# Patient Record
Sex: Female | Born: 1975 | Race: Black or African American | Hispanic: No | Marital: Single | Smoking: Never smoker
Health system: Southern US, Community
[De-identification: ages and names within clinical notes are randomized; demographics above are authoritative.]

## PROBLEM LIST (undated history)

## (undated) DIAGNOSIS — F419 Anxiety disorder, unspecified: Secondary | ICD-10-CM

---

## 2014-09-14 ENCOUNTER — Ambulatory Visit: Payer: Self-pay | Admitting: Family Medicine

## 2014-11-09 ENCOUNTER — Encounter: Payer: Self-pay | Admitting: Family Medicine

## 2018-06-25 ENCOUNTER — Other Ambulatory Visit: Payer: Self-pay | Admitting: Obstetrics and Gynecology

## 2018-06-25 DIAGNOSIS — Z1231 Encounter for screening mammogram for malignant neoplasm of breast: Secondary | ICD-10-CM

## 2018-08-05 ENCOUNTER — Ambulatory Visit (HOSPITAL_COMMUNITY): Payer: Self-pay

## 2020-07-12 ENCOUNTER — Emergency Department (HOSPITAL_BASED_OUTPATIENT_CLINIC_OR_DEPARTMENT_OTHER)
Admission: EM | Admit: 2020-07-12 | Discharge: 2020-07-12 | Disposition: A | Discharge status: Home / Self Care | Payer: No Typology Code available for payment source | Attending: Emergency Medicine | Admitting: Emergency Medicine

## 2020-07-12 ENCOUNTER — Encounter (HOSPITAL_BASED_OUTPATIENT_CLINIC_OR_DEPARTMENT_OTHER): Payer: Self-pay | Admitting: Emergency Medicine

## 2020-07-12 ENCOUNTER — Other Ambulatory Visit: Payer: Self-pay

## 2020-07-12 DIAGNOSIS — M79661 Pain in right lower leg: Secondary | ICD-10-CM | POA: Insufficient documentation

## 2020-07-12 DIAGNOSIS — Z5321 Procedure and treatment not carried out due to patient leaving prior to being seen by health care provider: Secondary | ICD-10-CM | POA: Insufficient documentation

## 2020-07-12 NOTE — ED Triage Notes (Signed)
Pt is c/o pain in her right calf that started earlier today  Denies injury  Denies recent travel

## 2020-07-12 NOTE — ED Notes (Signed)
Per registration, pt leaving at this time. LWBS 

## 2020-07-13 ENCOUNTER — Emergency Department (HOSPITAL_BASED_OUTPATIENT_CLINIC_OR_DEPARTMENT_OTHER)
Admission: EM | Admit: 2020-07-13 | Discharge: 2020-07-13 | Disposition: A | Discharge status: Home / Self Care | Payer: No Typology Code available for payment source | Attending: Emergency Medicine | Admitting: Emergency Medicine

## 2020-07-13 ENCOUNTER — Emergency Department (HOSPITAL_BASED_OUTPATIENT_CLINIC_OR_DEPARTMENT_OTHER): Payer: No Typology Code available for payment source

## 2020-07-13 ENCOUNTER — Other Ambulatory Visit: Payer: Self-pay

## 2020-07-13 ENCOUNTER — Encounter (HOSPITAL_BASED_OUTPATIENT_CLINIC_OR_DEPARTMENT_OTHER): Payer: Self-pay | Admitting: Emergency Medicine

## 2020-07-13 DIAGNOSIS — E876 Hypokalemia: Secondary | ICD-10-CM

## 2020-07-13 DIAGNOSIS — R072 Precordial pain: Secondary | ICD-10-CM

## 2020-07-13 DIAGNOSIS — M79661 Pain in right lower leg: Secondary | ICD-10-CM | POA: Diagnosis not present

## 2020-07-13 DIAGNOSIS — R079 Chest pain, unspecified: Secondary | ICD-10-CM | POA: Diagnosis present

## 2020-07-13 HISTORY — DX: Anxiety disorder, unspecified: F41.9

## 2020-07-13 LAB — CBC
HCT: 38.4 % (ref 36.0–46.0)
Hemoglobin: 12.2 g/dL (ref 12.0–15.0)
MCH: 26.4 pg (ref 26.0–34.0)
MCHC: 31.8 g/dL (ref 30.0–36.0)
MCV: 83.1 fL (ref 80.0–100.0)
Platelets: 332 10*3/uL (ref 150–400)
RBC: 4.62 MIL/uL (ref 3.87–5.11)
RDW: 13.9 % (ref 11.5–15.5)
WBC: 8.6 10*3/uL (ref 4.0–10.5)
nRBC: 0 % (ref 0.0–0.2)

## 2020-07-13 LAB — TROPONIN I (HIGH SENSITIVITY)
Troponin I (High Sensitivity): <2 ng/L (ref <18)
Troponin I (High Sensitivity): <2 ng/L (ref <18)

## 2020-07-13 LAB — D-DIMER, QUANTITATIVE: D-Dimer, Quant: <0.27 ug/mL-FEU (ref 0.00–0.50)

## 2020-07-13 LAB — BASIC METABOLIC PANEL
Anion gap: 10 (ref 5–15)
BUN: 14 mg/dL (ref 6–20)
CO2: 24 mmol/L (ref 22–32)
Calcium: 8.8 mg/dL — ABNORMAL LOW (ref 8.9–10.3)
Chloride: 105 mmol/L (ref 98–111)
Creatinine, Ser: 0.74 mg/dL (ref 0.44–1.00)
GFR calc Af Amer: >60 mL/min (ref >60)
GFR calc non Af Amer: >60 mL/min (ref >60)
Glucose, Bld: 102 mg/dL — ABNORMAL HIGH (ref 70–99)
Potassium: 3.1 mmol/L — ABNORMAL LOW (ref 3.5–5.1)
Sodium: 139 mmol/L (ref 135–145)

## 2020-07-13 LAB — PREGNANCY, URINE: Preg Test, Ur: NEGATIVE

## 2020-07-13 MED ORDER — POTASSIUM CHLORIDE CRYS ER 20 MEQ PO TBCR
40.0000 meq | EXTENDED_RELEASE_TABLET | Freq: Once | ORAL | Status: AC
  Administered 2020-07-13: 40 meq via ORAL
  Filled 2020-07-13: qty 2

## 2020-07-13 NOTE — ED Triage Notes (Signed)
Reports central chest pain for a few minutes tonight. Continues to complain of leg pain on R side, onset earlier today.

## 2020-07-13 NOTE — ED Provider Notes (Signed)
[facility] EMERGENCY DEPARTMENT Provider Note   CSN: [id number] Arrival date & time: 07/13/20  0347     History Chief Complaint  Patient presents with  . Chest Pain  . Leg Pain    Whitney Anthony is a 44 y.o. female.  Patient c/o mid chest pain at rest early this AM. Was resting, noted sharp pain, in midline, lower sternum, non radiating, lasted a few minutes. Denies any recent exertional cp or discomfort. No unusual doe or fatigue. No associated nv, diaphoresis, or sob. No cough or uri symptoms. No heartburn. No hx cad. No hx gerd. No hx dvt or pe. No fam hx premature cad - states one of her grandparents had heart trouble. Patient also indicates intermittently in past she has pain in right lower leg/calf. No immobility, trauma, travel, surgery, or injury. States in the past when that happens she will walk it out, or stretch, and pain goes away. No swelling to leg. No skin changes/redness.   The history is provided by the patient.  Chest Pain Associated symptoms: no abdominal pain, no back pain, no cough, no fever, no headache, no numbness, no palpitations, no shortness of breath and no vomiting   Leg Pain Associated symptoms: no back pain, no fever and no neck pain        Past Medical History:  Diagnosis Date  . Anxiety     There are no problems to display for this patient.   History reviewed. No pertinent surgical history.   OB History   No obstetric history on file.     Family History  Problem Relation Age of Onset  . Pancreatitis Mother     Social History   Tobacco Use  . Smoking status: Never Smoker  . Smokeless tobacco: Never Used  Vaping Use  . Vaping Use: Never used  Substance Use Topics  . Alcohol use: Never  . Drug use: Never    Home Medications Prior to Admission medications   Not on File    Allergies    Patient has no known allergies.  Review of Systems   Review of Systems  Constitutional: Negative for chills and fever.   HENT: Negative for sore throat.   Eyes: Negative for redness.  Respiratory: Negative for cough and shortness of breath.   Cardiovascular: Positive for chest pain. Negative for palpitations and leg swelling.  Gastrointestinal: Negative for abdominal pain and vomiting.  Genitourinary: Negative for flank pain.  Musculoskeletal: Negative for back pain and neck pain.  Skin: Negative for rash.  Neurological: Negative for numbness and headaches.  Hematological: Does not bruise/bleed easily.  Psychiatric/Behavioral: Negative for confusion.    Physical Exam Updated Vital Signs BP 114/67 (BP Location: Right Arm)   Pulse 78   Temp 98.6 F (37 C) (Oral)   Resp 16   Ht 1.651 m (5\' 5" )   Wt 72.6 kg   LMP 06/26/2020 (Exact Date)   SpO2 100%   BMI 26.63 kg/m   Physical Exam Vitals and nursing note reviewed.  Constitutional:      Appearance: Normal appearance. She is well-developed.  HENT:     Head: Atraumatic.     Nose: Nose normal.     Mouth/Throat:     Mouth: Mucous membranes are moist.  Eyes:     General: No scleral icterus.    Conjunctiva/sclera: Conjunctivae normal.  Neck:     Trachea: No tracheal deviation.  Cardiovascular:     Rate and Rhythm: Normal rate and regular rhythm.  Pulses: Normal pulses.     Heart sounds: Normal heart sounds. No murmur heard.  No friction rub. No gallop.   Pulmonary:     Effort: Pulmonary effort is normal. No respiratory distress.     Breath sounds: Normal breath sounds.  Chest:     Chest wall: No tenderness.  Abdominal:     General: Bowel sounds are normal. There is no distension.     Palpations: Abdomen is soft.     Tenderness: There is no abdominal tenderness. There is no guarding.  Genitourinary:    Comments: No cva tenderness.  Musculoskeletal:        General: No swelling or tenderness.     Cervical back: Normal range of motion and neck supple. No rigidity. No muscular tenderness.     Right lower leg: No edema.     Left lower  leg: No edema.     Comments: Lower extremities appear normal, no swelling. No focal bony pain, no calf tenderness. Distal pulses palp bil.   Skin:    General: Skin is warm and dry.     Findings: No rash.  Neurological:     Mental Status: She is alert.     Comments: Alert, speech normal. Steady gait.   Psychiatric:        Mood and Affect: Mood normal.     ED Results / Procedures / Treatments   Labs (all labs ordered are listed, but only abnormal results are displayed) Results for orders placed or performed during the hospital encounter of 07/13/20  Basic metabolic panel  Result Value Ref Range   Sodium 139 135 - 145 mmol/L   Potassium 3.1 (L) 3.5 - 5.1 mmol/L   Chloride 105 98 - 111 mmol/L   CO2 24 22 - 32 mmol/L   Glucose, Bld 102 (H) 70 - 99 mg/dL   BUN 14 6 - 20 mg/dL   Creatinine, Ser 8.12 0.44 - 1.00 mg/dL   Calcium 8.8 (L) 8.9 - 10.3 mg/dL   GFR calc non Af Amer >60 >60 mL/min   GFR calc Af Amer >60 >60 mL/min   Anion gap 10 5 - 15  CBC  Result Value Ref Range   WBC 8.6 4.0 - 10.5 K/uL   RBC 4.62 3.87 - 5.11 MIL/uL   Hemoglobin 12.2 12.0 - 15.0 g/dL   HCT 75.1 36 - 46 %   MCV 83.1 80.0 - 100.0 fL   MCH 26.4 26.0 - 34.0 pg   MCHC 31.8 30.0 - 36.0 g/dL   RDW 70.0 17.4 - 94.4 %   Platelets 332 150 - 400 K/uL   nRBC 0.0 0.0 - 0.2 %  D-dimer, quantitative (not at [facility])  Result Value Ref Range   D-Dimer, Quant <0.27 0.00 - 0.50 ug/mL-FEU  Troponin I (High Sensitivity)  Result Value Ref Range   Troponin I (High Sensitivity) <2 <18 ng/L  Troponin I (High Sensitivity)  Result Value Ref Range   Troponin I (High Sensitivity) <2 <18 ng/L   DG Chest 2 View  Result Date: 07/13/2020 CLINICAL DATA:  Central chest pain for a few minutes EXAM: CHEST - 2 VIEW COMPARISON:  None. FINDINGS: The heart size and mediastinal contours are within normal limits. Both lungs are clear. The visualized skeletal structures are unremarkable. IMPRESSION: No active cardiopulmonary disease.  Electronically Signed   By: Burman Nieves M.D.   On: 07/13/2020 04:12    EKG EKG Interpretation  Date/Time:  Friday July 13 2020 03:53:11 EDT Ventricular  Rate:  79 PR Interval:  186 QRS Duration: 76 QT Interval:  380 QTC Calculation: 435 R Axis:   78 Text Interpretation: Normal sinus rhythm with sinus arrhythmia Normal ECG No previous ECGs available Confirmed by Paula Libra ([id number]) on 07/13/2020 4:20:06 AM   Radiology DG Chest 2 View  Result Date: 07/13/2020 CLINICAL DATA:  Central chest pain for a few minutes EXAM: CHEST - 2 VIEW COMPARISON:  None. FINDINGS: The heart size and mediastinal contours are within normal limits. Both lungs are clear. The visualized skeletal structures are unremarkable. IMPRESSION: No active cardiopulmonary disease. Electronically Signed   By: Burman Nieves M.D.   On: 07/13/2020 04:12    Procedures Procedures (including critical care time)  Medications Ordered in ED Medications - No data to display  ED Course  I have reviewed the triage vital signs and the nursing notes.  Pertinent labs & imaging results that were available during my care of the patient were reviewed by me and considered in my medical decision making (see chart for details).    MDM Rules/Calculators/A&P                         Labs sent.  ECZG. CXR.  Reviewed nursing notes and prior charts for additional history.   Labs reviewed/interpreted by me - trop normal. k sl elev. kcl po.   CXR reviewed/interpreted by me - no pna.  Additional labs reviewed/interpreted by me - repeat trop normal. ddimer normal.   Patient currently symptom free, and appears stable for d/c.   Rec pcp f/u.    Final Clinical Impression(s) / ED Diagnoses Final diagnoses:  None    Rx / DC Orders ED Discharge Orders    None       Cathren Laine, MD 07/13/20 [phone]

## 2020-07-13 NOTE — Discharge Instructions (Addendum)
It was our pleasure to provide your ER care today - we hope that you feel better.  Your heart tests look good/normal.  From today's labs, your potassium level is mildly low (3.1) - eat plenty of fruits and vegetables, and follow up with primary care doctor in 1-2 weeks.   Take ibuprofen or naprosyn as need.  If/when calf/muscle pain, try stretching of area, heat therapy, or gentle massage (if leg swelling, and/or severe pain, return to ED).  Return to ER if worse, new symptoms, fevers, increased trouble breathing, recurrent/persistent chest pain, leg swelling or severe pain, or other concern.

## 2022-01-06 ENCOUNTER — Emergency Department (HOSPITAL_BASED_OUTPATIENT_CLINIC_OR_DEPARTMENT_OTHER): Payer: No Typology Code available for payment source

## 2022-01-06 ENCOUNTER — Emergency Department (HOSPITAL_BASED_OUTPATIENT_CLINIC_OR_DEPARTMENT_OTHER)
Admission: EM | Admit: 2022-01-06 | Discharge: 2022-01-07 | Disposition: A | Discharge status: Home / Self Care | Payer: No Typology Code available for payment source | Attending: Emergency Medicine | Admitting: Emergency Medicine

## 2022-01-06 ENCOUNTER — Encounter (HOSPITAL_BASED_OUTPATIENT_CLINIC_OR_DEPARTMENT_OTHER): Payer: Self-pay

## 2022-01-06 ENCOUNTER — Other Ambulatory Visit: Payer: Self-pay

## 2022-01-06 DIAGNOSIS — M79662 Pain in left lower leg: Secondary | ICD-10-CM | POA: Diagnosis not present

## 2022-01-06 DIAGNOSIS — M79604 Pain in right leg: Secondary | ICD-10-CM | POA: Insufficient documentation

## 2022-01-06 DIAGNOSIS — M25512 Pain in left shoulder: Secondary | ICD-10-CM | POA: Insufficient documentation

## 2022-01-06 DIAGNOSIS — M79669 Pain in unspecified lower leg: Secondary | ICD-10-CM

## 2022-01-06 DIAGNOSIS — R222 Localized swelling, mass and lump, trunk: Secondary | ICD-10-CM

## 2022-01-06 DIAGNOSIS — M25562 Pain in left knee: Secondary | ICD-10-CM | POA: Insufficient documentation

## 2022-01-06 MED ORDER — CELECOXIB 200 MG PO CAPS: 200.0000 mg | ORAL_CAPSULE | Freq: Two times a day (BID) | ORAL | 0 refills | Status: AC

## 2022-01-06 MED ORDER — METHYLPREDNISOLONE 4 MG PO TBPK: ORAL_TABLET | ORAL | 0 refills | Status: AC

## 2022-01-06 NOTE — ED Provider Notes (Signed)
[facility] EMERGENCY DEPARTMENT Provider Note   CSN: [id number] Arrival date & time: 01/06/22  2020     History  Chief Complaint  Patient presents with   Leg Pain    Whitney Anthony is a 46 y.o. female who presents with right leg pain and left shoulder pain.  Patient states that 3 days ago she began having tightness and pain behind the left knee and pain in her left calf.  The pain is worse when she is sitting or lying down, better when she is standing and walking.  At times pain is 9 out of 10, currently 5 out of 10.  She describes the pain as aching.  She is never had anything like this before.  She denies back pain, injury. Patient also noticed at tender swollen nodule above her left clavicle beginning 2 days ago.  She denies cough, shortness of breath, weakness, unexplained weight loss, fever, soaking night sweats.   Leg Pain     Home Medications Prior to Admission medications   Not on File      Allergies    Patient has no known allergies.    Review of Systems   Review of Systems As per the HPI Physical Exam Updated Vital Signs BP 120/78 (BP Location: Left Arm)    Pulse 80    Temp 98.4 F (36.9 C) (Oral)    Resp 18    Ht 5\' 5"  (1.651 m)    Wt 81.6 kg    SpO2 100%    BMI 29.95 kg/m  Physical Exam Vitals and nursing note reviewed.  Constitutional:      General: She is not in acute distress.    Appearance: She is well-developed. She is not diaphoretic.  HENT:     Head: Normocephalic and atraumatic.     Right Ear: External ear normal.     Left Ear: External ear normal.     Nose: Nose normal.     Mouth/Throat:     Mouth: Mucous membranes are moist.  Eyes:     General: No scleral icterus.    Conjunctiva/sclera: Conjunctivae normal.  Cardiovascular:     Rate and Rhythm: Normal rate and regular rhythm.     Heart sounds: Normal heart sounds. No murmur heard.   No friction rub. No gallop.  Pulmonary:     Effort: Pulmonary effort is normal. No  respiratory distress.     Breath sounds: Normal breath sounds.  Chest:    Abdominal:     General: Bowel sounds are normal. There is no distension.     Palpations: Abdomen is soft. There is no mass.     Tenderness: There is no abdominal tenderness. There is no guarding.  Musculoskeletal:        General: No swelling or tenderness.     Cervical back: Normal range of motion.     Comments: Positive straight leg test on the left at approximately 35 degrees Normal pulses no obvious swelling  Skin:    General: Skin is warm and dry.  Neurological:     Mental Status: She is alert and oriented to person, place, and time.  Psychiatric:        Behavior: Behavior normal.    ED Results / Procedures / Treatments   Labs (all labs ordered are listed, but only abnormal results are displayed) Labs Reviewed - No data to display  EKG None  Radiology No results found.  Procedures Procedures    Medications Ordered in ED Medications -  No data to display  ED Course/ Medical Decision Making/ A&P                           Medical Decision Making Patient here with complaint of right sided calf pain and swelling in the left supraclavicular region. Regarding the calf pain differential diagnosis includes radiculopathy, neuralgia, neuropathy, cellulitis.  I reviewed ultrasound images ordered at triage which showed no evidence of DVT.  She has a positive straight leg test and I suspect that her symptoms are related to sciatica.  We will treat with anti-inflammatory medication and a Medrol Dosepak. Patient does have tender mobile mass in the left upper supraclavicular region.  I ordered and reviewed a chest x-ray which showed no acute findings.  Question supraclavicular lymph node.  Patient is advised to follow very closely with her primary care physician for further evaluation.  She does not have other palpable lymphadenopathy.  Patient appears appropriate for discharge with conservative treatment and  close PCP follow-up.  Amount and/or Complexity of Data Reviewed Radiology: ordered and independent interpretation performed. ECG/medicine tests: ordered.  Risk Prescription drug management.   Final Clinical Impression(s) / ED Diagnoses Final diagnoses:  None    Rx / DC Orders ED Discharge Orders     None         Arthor Captain, PA-C 01/08/22 0416    Pricilla Loveless, MD 01/11/22 [phone]

## 2022-01-06 NOTE — Discharge Instructions (Addendum)
° °  Follow up ASAP with your primary care doctor  Contact a health care provider if: You have pain that: Wakes you up when you are sleeping. Gets worse when you lie down. Is worse than you have experienced in the past. Lasts longer than 4 weeks. You have an unexplained weight loss. Get help right away if: You are not able to control when you urinate or have bowel movements (incontinence). You have: Weakness in your lower back, pelvis, buttocks, or legs that gets worse. Redness or swelling of your back. A burning sensation when you urinate.

## 2022-01-06 NOTE — ED Triage Notes (Signed)
Pt c/o pain to left calf day 2-c/o swelling to left side of neck day 3-denies injury at either site-NAD-steady gait

## 2023-09-10 IMAGING — DX DG CHEST 2V
2 series · 2 of 2 positions shown · non-contrast
Comparison: 07/13/2020

CLINICAL DATA: Left supraclavicular pain

EXAM:
CHEST - 2 VIEW

[chest pa]
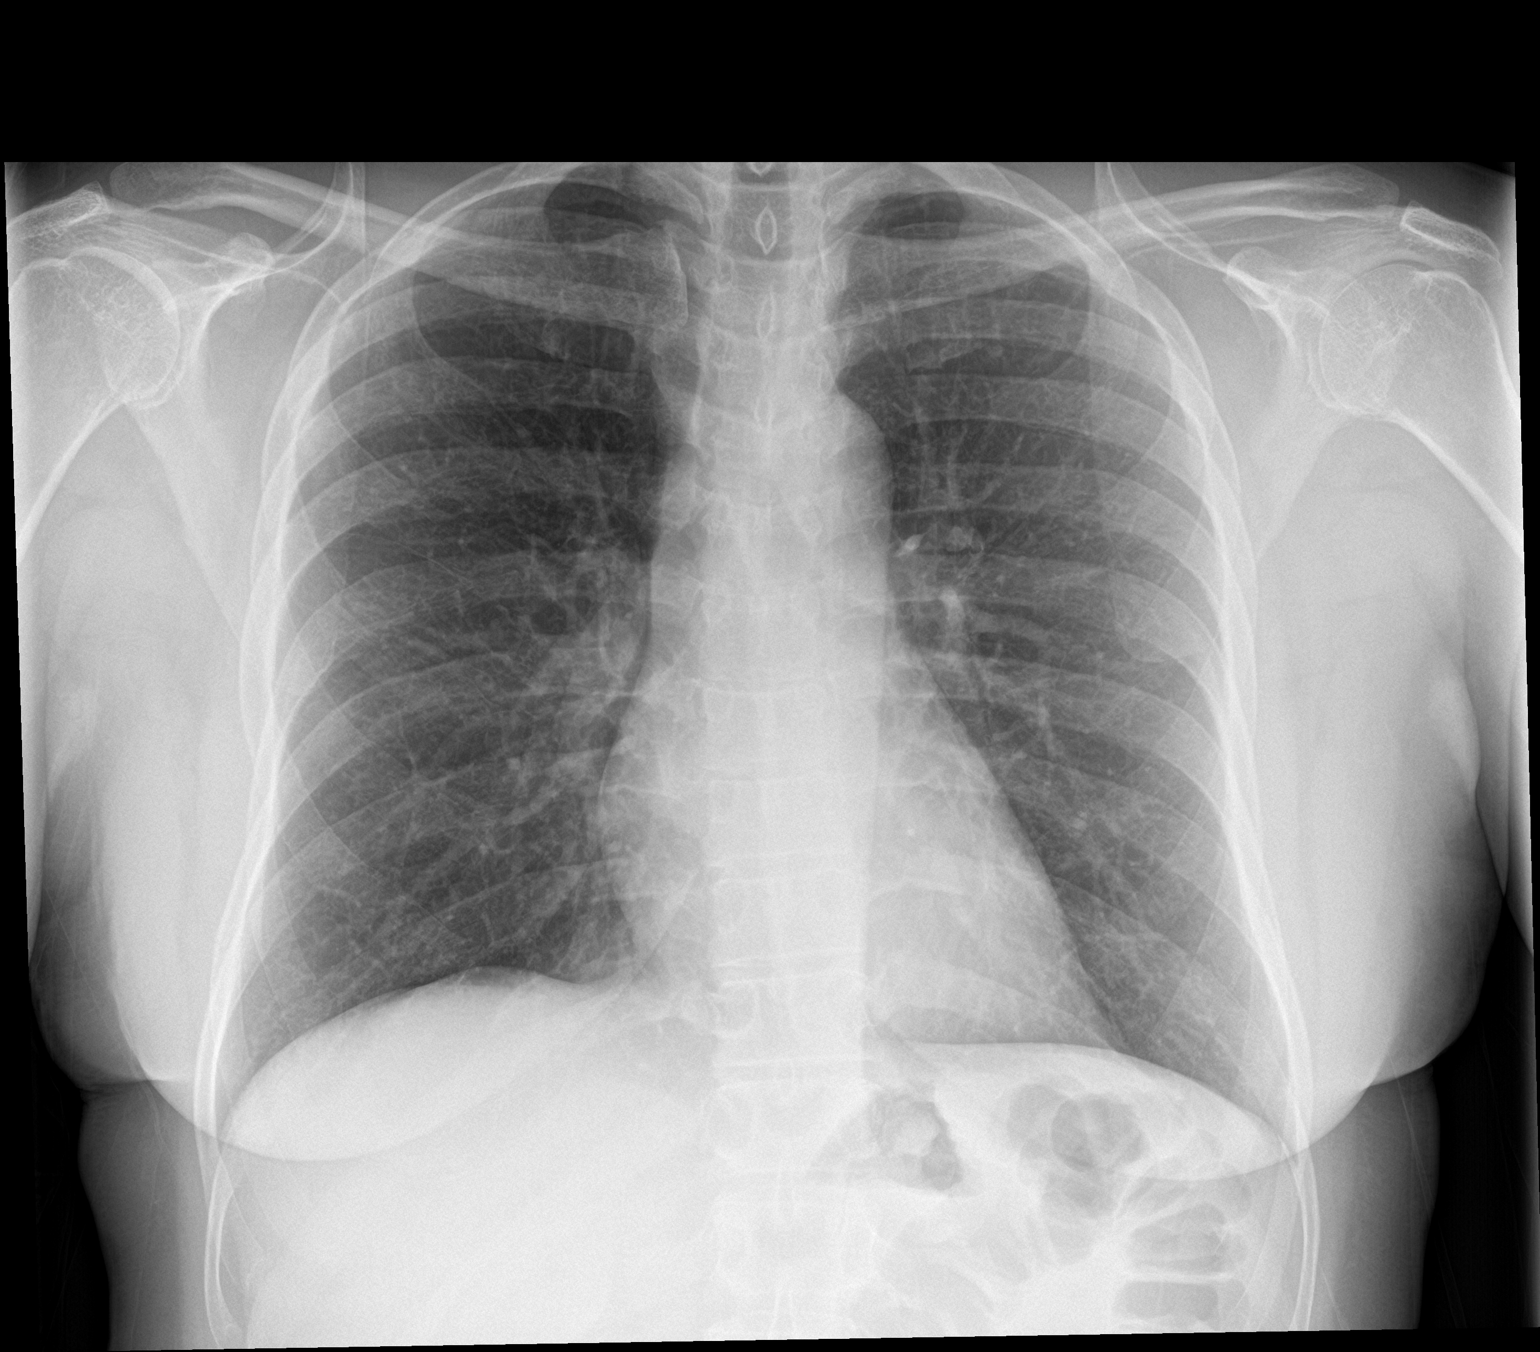

[chest lat]
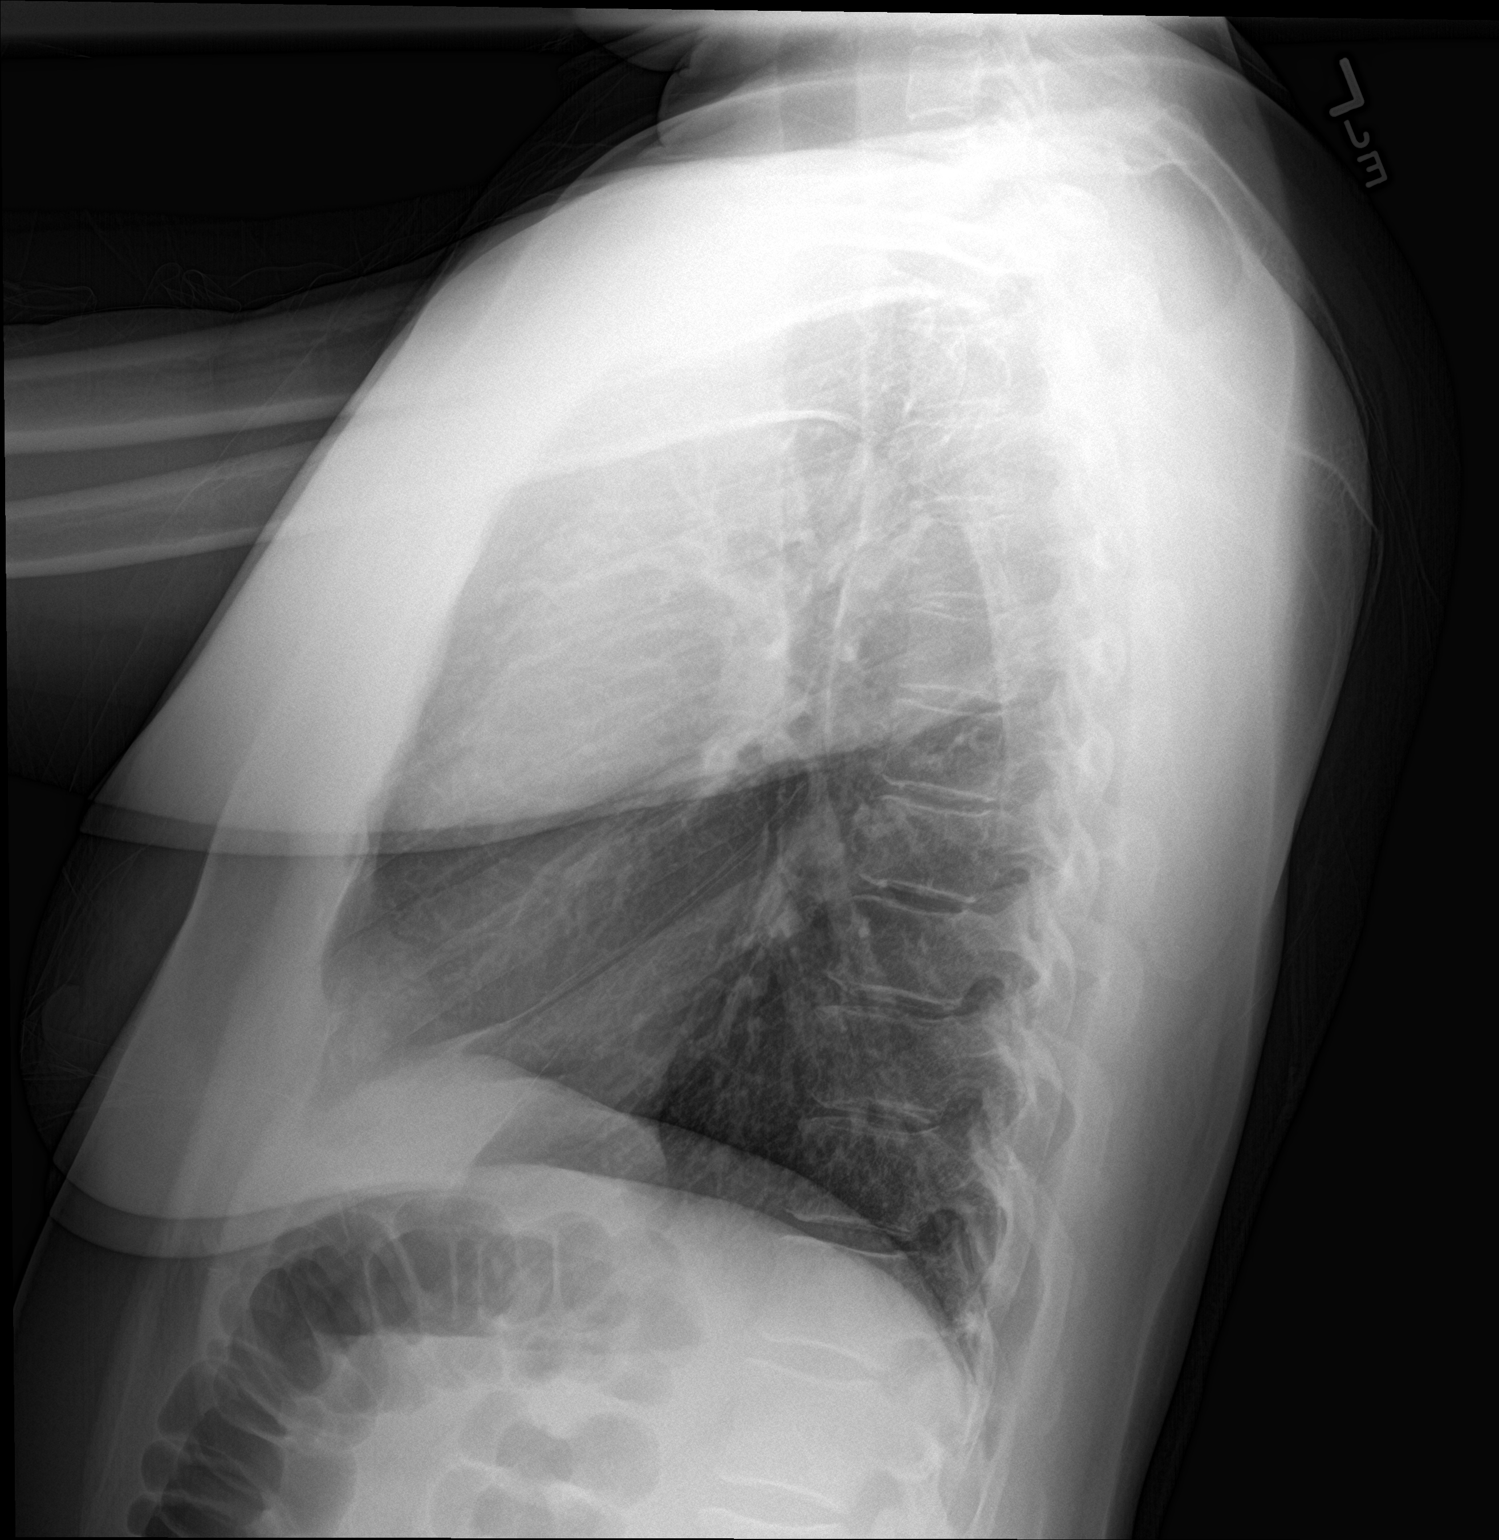

[2 of 2 positions shown; findings below may reference images not displayed]

FINDINGS: The heart size and mediastinal contours are within normal limits.
Both lungs are clear. The visualized skeletal structures are
unremarkable.
IMPRESSION: No active cardiopulmonary disease.

## 2023-09-10 IMAGING — US US EXTREM LOW VENOUS*R*
1 series · 14 of 24 positions shown · non-contrast
Comparison: None.

CLINICAL DATA: Calf pain.

EXAM:
RIGHT LOWER EXTREMITY VENOUS DOPPLER ULTRASOUND
TECHNIQUE: Gray-scale sonography with compression, as well as color and duplex
ultrasound, were performed to evaluate the deep venous system(s)
from the level of the common femoral vein through the popliteal and
proximal calf veins.

[Series 1: us extrem low venous*right* · 14 of 28 slices shown]
[im 1/28]
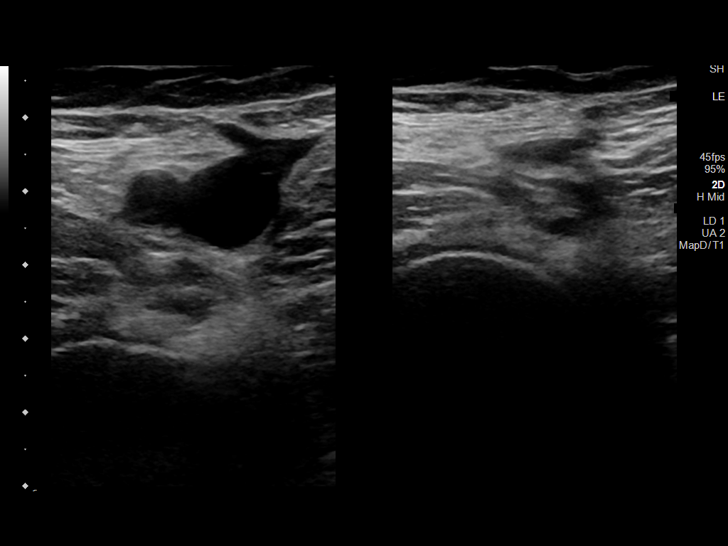
[im 3/28]
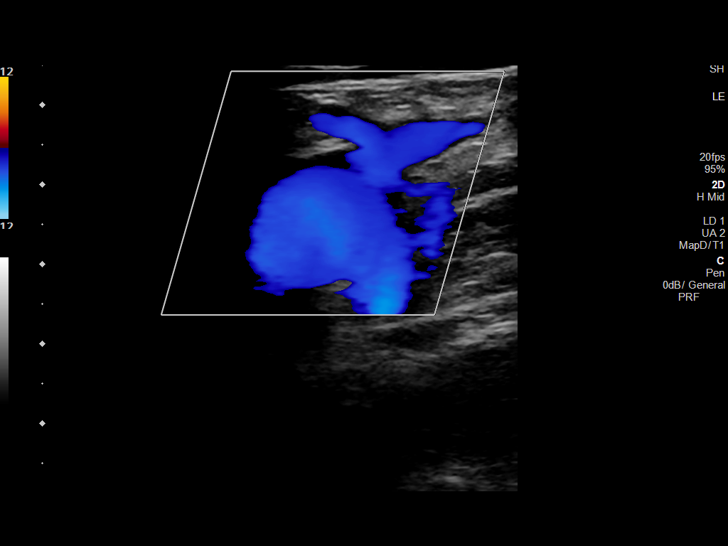
[im 5/28]
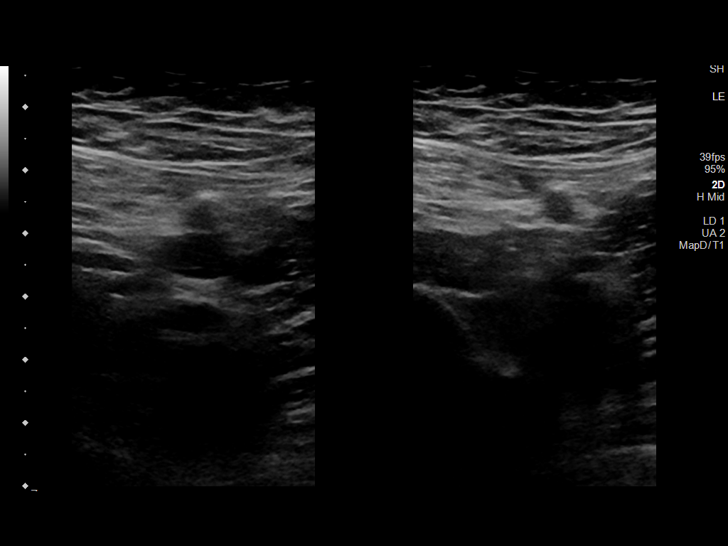
[im 8/28]
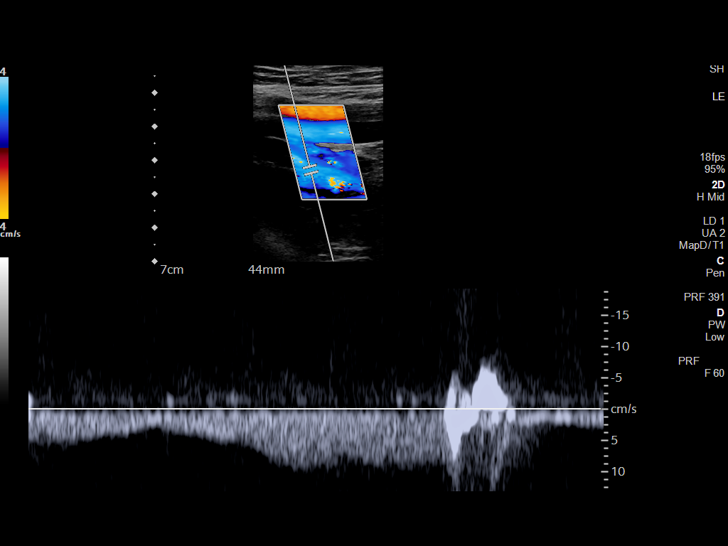
[im 9/28]
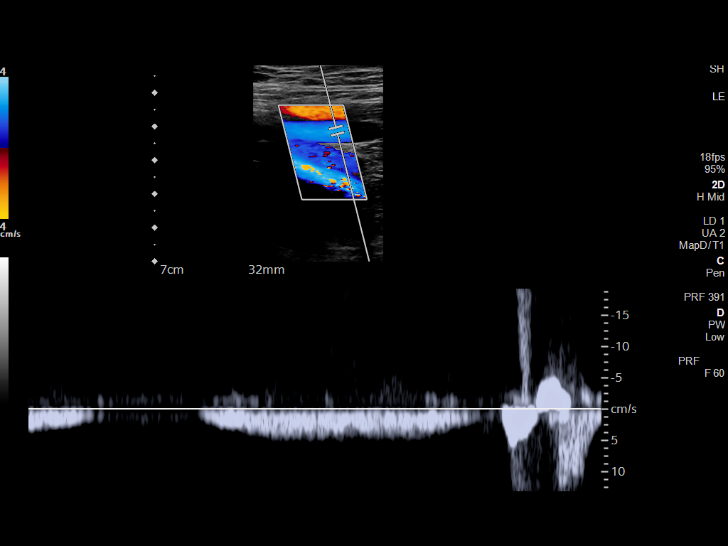
[im 11/28]
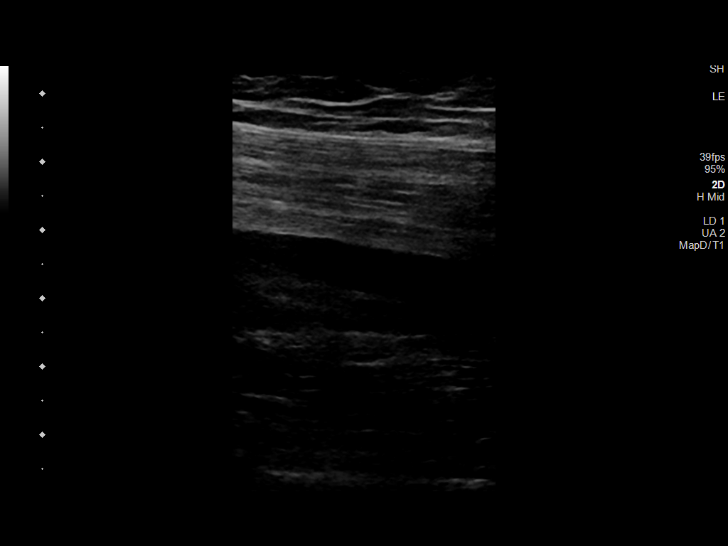
[im 13/28]
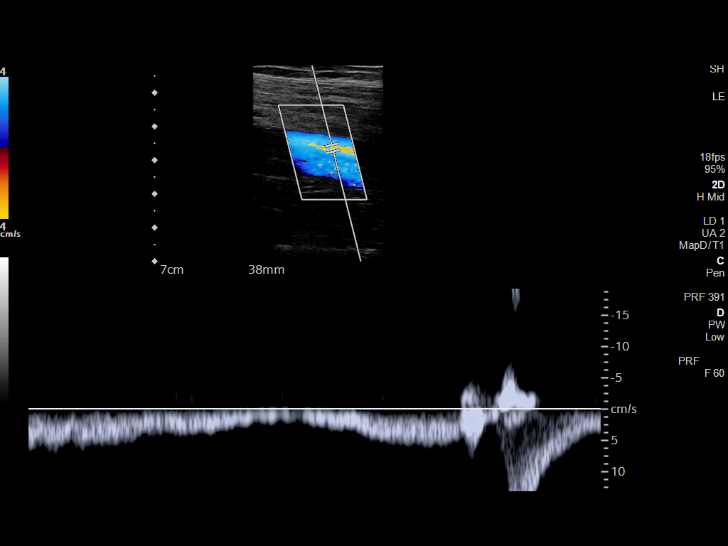
[im 15/28]
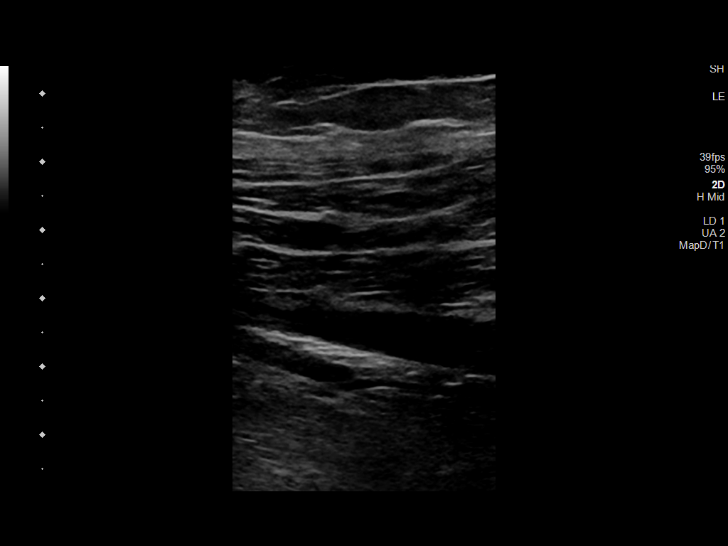
[im 17/28]
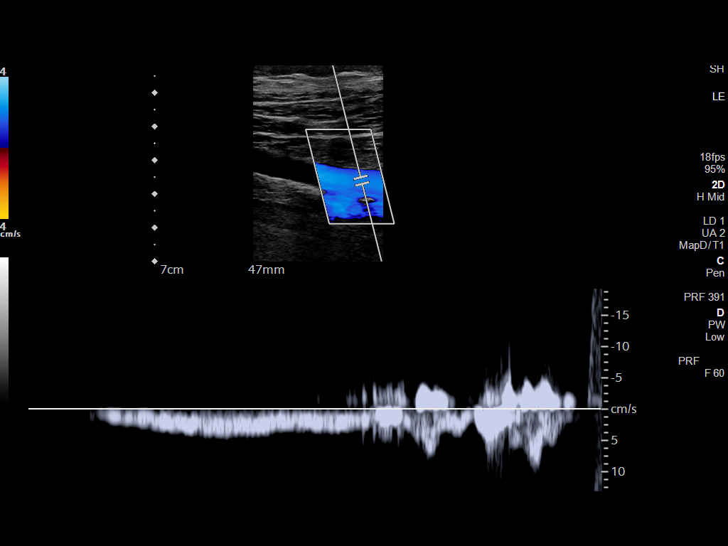
[im 19/28]
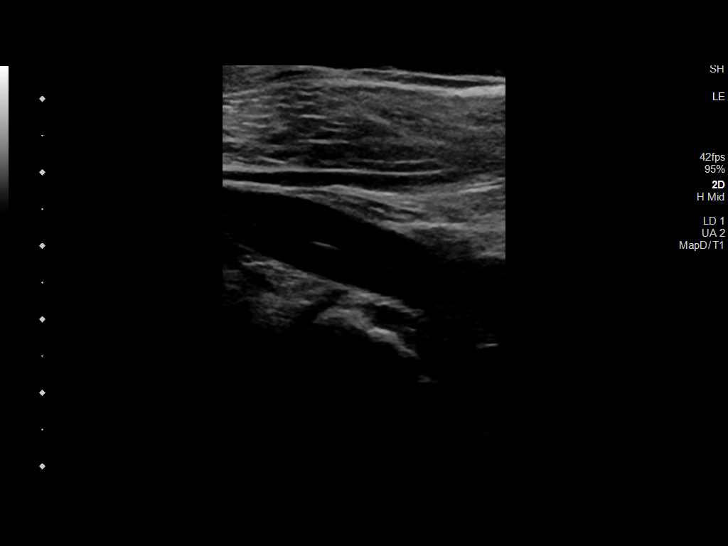
[im 22/28]
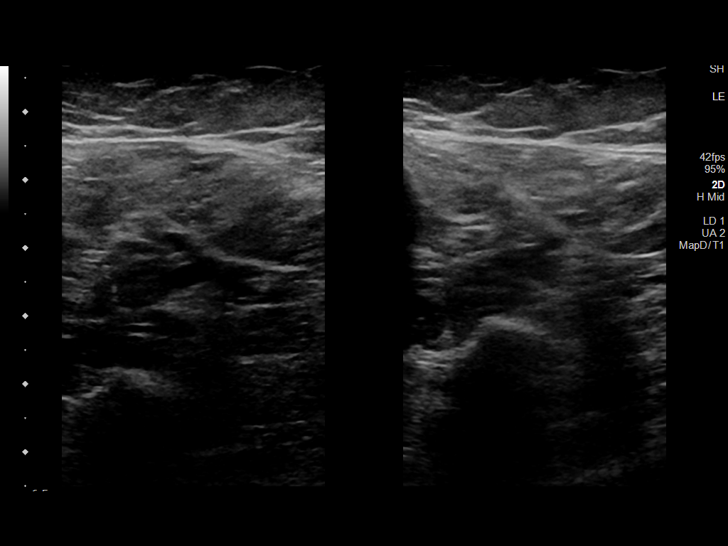
[im 23/28]
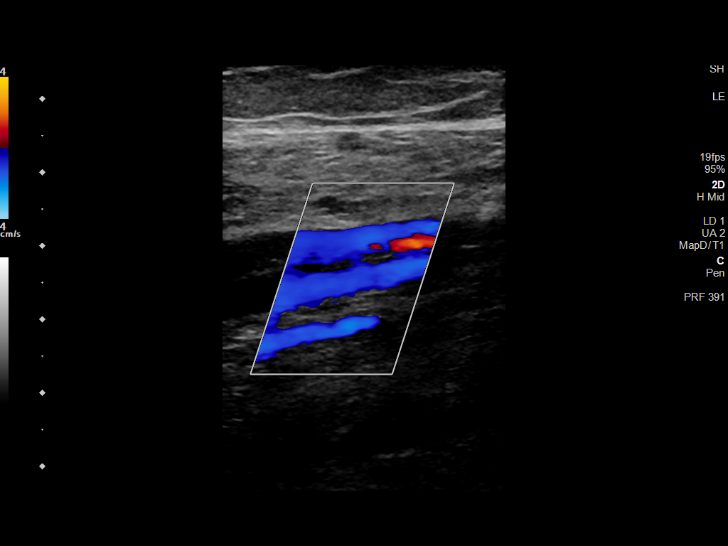
[im 25/28]
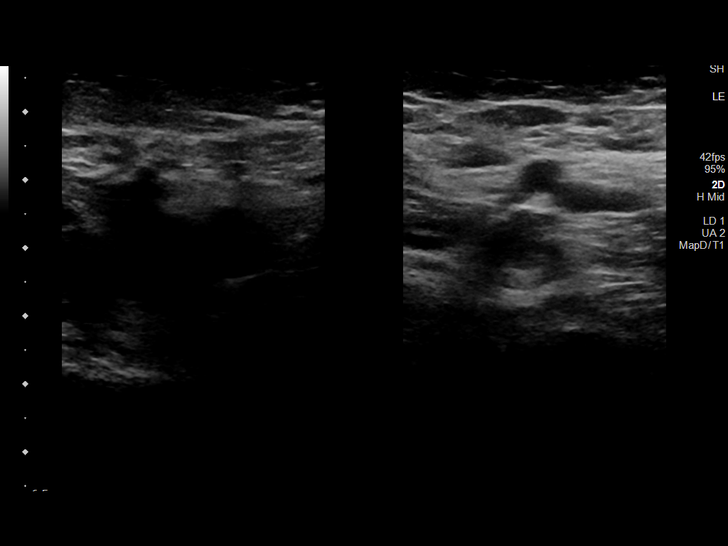
[im 28/28]
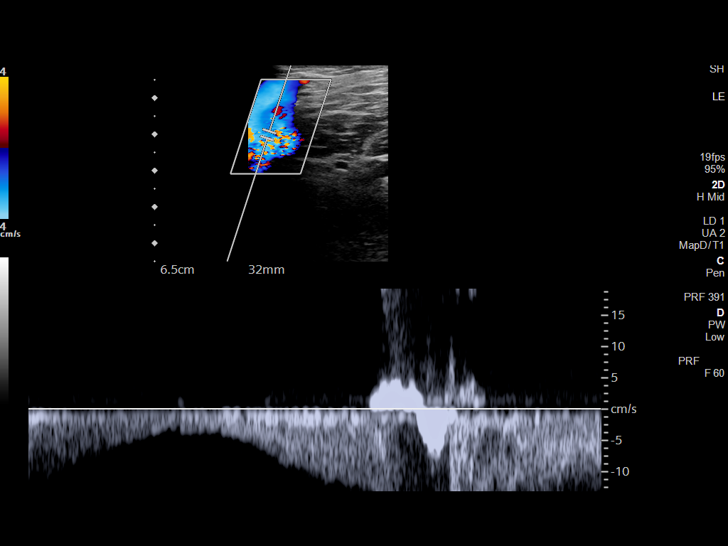

[14 of 24 positions shown; findings below may reference images not displayed]

FINDINGS: VENOUS

Normal compressibility of the common femoral, superficial femoral,
and popliteal veins, as well as the visualized calf veins.
Visualized portions of profunda femoral vein and great saphenous
vein unremarkable. No filling defects to suggest DVT on grayscale or
color Doppler imaging. Doppler waveforms show normal direction of
venous flow, normal respiratory plasticity and response to
augmentation.

Limited views of the contralateral common femoral vein are
unremarkable.

OTHER

None.

Limitations: none
IMPRESSION: Negative.

## 2024-01-21 ENCOUNTER — Other Ambulatory Visit: Payer: Self-pay

## 2024-01-21 ENCOUNTER — Encounter (HOSPITAL_BASED_OUTPATIENT_CLINIC_OR_DEPARTMENT_OTHER): Payer: Self-pay | Admitting: Emergency Medicine

## 2024-01-21 DIAGNOSIS — Z5321 Procedure and treatment not carried out due to patient leaving prior to being seen by health care provider: Secondary | ICD-10-CM | POA: Insufficient documentation

## 2024-01-21 DIAGNOSIS — R059 Cough, unspecified: Secondary | ICD-10-CM | POA: Diagnosis present

## 2024-01-21 DIAGNOSIS — J101 Influenza due to other identified influenza virus with other respiratory manifestations: Secondary | ICD-10-CM | POA: Insufficient documentation

## 2024-01-21 NOTE — ED Triage Notes (Signed)
Pt reports flulike symptoms including "heart racing", cough, ShoB and dizziness with exertion, chills, and fever that started earlier today, low grade fever in triage, denies taking any OTC medications today

## 2024-01-22 ENCOUNTER — Emergency Department (HOSPITAL_BASED_OUTPATIENT_CLINIC_OR_DEPARTMENT_OTHER)
Admission: EM | Admit: 2024-01-22 | Discharge: 2024-01-22 | Discharge status: Against Medical Advice | Payer: 59 | Attending: Emergency Medicine | Admitting: Emergency Medicine

## 2024-01-22 DIAGNOSIS — J101 Influenza due to other identified influenza virus with other respiratory manifestations: Secondary | ICD-10-CM

## 2024-01-22 LAB — RESP PANEL BY RT-PCR (RSV, FLU A&B, COVID)  RVPGX2
Influenza A by PCR: POSITIVE — AB
Influenza B by PCR: NEGATIVE
Resp Syncytial Virus by PCR: NEGATIVE
SARS Coronavirus 2 by RT PCR: NEGATIVE

## 2024-01-22 NOTE — ED Notes (Signed)
Provider went to room to evaluate patient, but patient was not present. This RN was not able to find patient within the department. Will wait 20 minutes before officially taking her off the system as LWBS after triage.

## 2024-01-22 NOTE — ED Notes (Signed)
After about 20 minutes the patient still hasn't returned to her room. None of the staff noticed the patient walked out including registration staff. Patient LWBS after triage.

## 2024-01-22 NOTE — ED Provider Notes (Signed)
Patient left prior to my evaluation.   Nira Conn, MD 01/22/24 [phone]
